# Patient Record
Sex: Male | Born: 1995 | Race: Black or African American | Hispanic: No | Marital: Single
Health system: Southern US, Community
[De-identification: ages and names within clinical notes are randomized; demographics above are authoritative.]

---
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
## 2017-12-05 ENCOUNTER — Emergency Department (HOSPITAL_COMMUNITY)
Admission: EM | Admit: 2017-12-05 | Discharge: 2017-12-06 | Disposition: A | Discharge status: Home / Self Care | Payer: No Typology Code available for payment source | Attending: Emergency Medicine | Admitting: Emergency Medicine

## 2017-12-05 ENCOUNTER — Other Ambulatory Visit: Payer: Self-pay

## 2017-12-05 ENCOUNTER — Encounter (HOSPITAL_COMMUNITY): Payer: Self-pay | Admitting: Emergency Medicine

## 2017-12-05 DIAGNOSIS — S39012A Strain of muscle, fascia and tendon of lower back, initial encounter: Secondary | ICD-10-CM | POA: Diagnosis not present

## 2017-12-05 DIAGNOSIS — Y998 Other external cause status: Secondary | ICD-10-CM | POA: Diagnosis not present

## 2017-12-05 DIAGNOSIS — Y92410 Unspecified street and highway as the place of occurrence of the external cause: Secondary | ICD-10-CM | POA: Diagnosis not present

## 2017-12-05 DIAGNOSIS — S3992XA Unspecified injury of lower back, initial encounter: Secondary | ICD-10-CM | POA: Diagnosis present

## 2017-12-05 DIAGNOSIS — Y939 Activity, unspecified: Secondary | ICD-10-CM | POA: Insufficient documentation
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
## 2017-12-06 ENCOUNTER — Emergency Department (HOSPITAL_COMMUNITY): Payer: No Typology Code available for payment source

## 2017-12-06 MED ORDER — NAPROXEN 250 MG PO TABS
500.0000 mg | ORAL_TABLET | Freq: Once | ORAL | Status: AC
  Administered 2017-12-06: 500 mg via ORAL
  Filled 2017-12-06: qty 2

## 2017-12-06 MED ORDER — CYCLOBENZAPRINE HCL 5 MG PO TABS
7.5000 mg | ORAL_TABLET | Freq: Once | ORAL | Status: AC
  Administered 2017-12-06: 7.5 mg via ORAL
  Filled 2017-12-06: qty 1.5

## 2017-12-06 MED ORDER — CYCLOBENZAPRINE HCL 10 MG PO TABS
10.0000 mg | ORAL_TABLET | Freq: Three times a day (TID) | ORAL | 0 refills | Status: AC | PRN
Start: 2017-12-06 — End: ?

## 2017-12-06 MED ORDER — NAPROXEN 375 MG PO TABS: 375.0000 mg | ORAL_TABLET | Freq: Two times a day (BID) | ORAL | 0 refills | Status: AC

## 2017-12-06 NOTE — Discharge Instructions (Addendum)
Workup has been normal. Please take medications as prescribed and instructed.  Please take the Naproxen as prescribed for pain. Do not take any additional NSAIDs including Motrin, Aleve, Ibuprofen, Advil. Have been given first dose in the ED.  Please the the flexeril for muscle relaxation. This medication will make you drowsy so avoid situation that could place you in danger.    SEEK IMMEDIATE MEDICAL ATTENTION IF: New numbness, tingling, weakness, or problem with the use of your arms or legs.  Severe back pain not relieved with medications.  Change in bowel or bladder control.  Urinary retention.  Numbness in your groin.  Increasing pain in any areas of the body (such as chest or abdominal pain).  Shortness of breath, dizziness or fainting.  Nausea (feeling sick to your stomach), vomiting, fever, or sweats.

## 2017-12-06 NOTE — ED Notes (Signed)
ED Provider at bedside. 

## 2017-12-06 NOTE — ED Notes (Signed)
Patient transported to X-ray 

## 2017-12-06 NOTE — ED Notes (Signed)
Pt verbalizes understanding of d/c instructions. Pt received prescriptions. Pt ambulatory at d/c with all belongings.  
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
## 2019-05-19 ENCOUNTER — Other Ambulatory Visit: Payer: Self-pay

## 2019-05-19 DIAGNOSIS — Z20822 Contact with and (suspected) exposure to covid-19: Secondary | ICD-10-CM

## 2019-05-20 LAB — NOVEL CORONAVIRUS, NAA: SARS-CoV-2, NAA: NOT DETECTED

## 2019-08-26 ENCOUNTER — Ambulatory Visit: Payer: Self-pay | Attending: Internal Medicine

## 2019-08-26 DIAGNOSIS — Z20822 Contact with and (suspected) exposure to covid-19: Secondary | ICD-10-CM

## 2019-08-27 LAB — NOVEL CORONAVIRUS, NAA: SARS-CoV-2, NAA: NOT DETECTED
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
## 2020-01-12 ENCOUNTER — Ambulatory Visit: Payer: No Typology Code available for payment source | Attending: Internal Medicine

## 2020-08-20 ENCOUNTER — Other Ambulatory Visit: Payer: Self-pay

## 2020-08-20 DIAGNOSIS — Z20822 Contact with and (suspected) exposure to covid-19: Secondary | ICD-10-CM

## 2020-08-21 LAB — NOVEL CORONAVIRUS, NAA: SARS-CoV-2, NAA: DETECTED — AB

## 2020-08-21 LAB — SARS-COV-2, NAA 2 DAY TAT

## 2020-08-25 ENCOUNTER — Other Ambulatory Visit: Payer: Self-pay

## 2020-08-25 DIAGNOSIS — Z20822 Contact with and (suspected) exposure to covid-19: Secondary | ICD-10-CM

## 2020-08-28 LAB — NOVEL CORONAVIRUS, NAA: SARS-CoV-2, NAA: DETECTED — AB

## 2023-07-10 ENCOUNTER — Emergency Department (HOSPITAL_COMMUNITY): Admission: EM | Admit: 2023-07-10 | Discharge: 2023-07-10 | Discharge status: Against Medical Advice | Payer: Self-pay

## 2023-07-10 NOTE — ED Notes (Signed)
Pt refusing to be seen and refusing to come to adult triage from peds side. Pt requesting to be taken off the list. Triage RN notified.

## 2023-07-13 ENCOUNTER — Other Ambulatory Visit: Payer: Self-pay

## 2023-07-13 ENCOUNTER — Emergency Department (HOSPITAL_COMMUNITY): Payer: No Typology Code available for payment source

## 2023-07-13 ENCOUNTER — Encounter (HOSPITAL_COMMUNITY): Payer: Self-pay

## 2023-07-13 ENCOUNTER — Emergency Department (HOSPITAL_COMMUNITY)
Admission: EM | Admit: 2023-07-13 | Discharge: 2023-07-13 | Disposition: A | Discharge status: Home / Self Care | Payer: No Typology Code available for payment source | Attending: Emergency Medicine | Admitting: Emergency Medicine

## 2023-07-13 DIAGNOSIS — S0501XA Injury of conjunctiva and corneal abrasion without foreign body, right eye, initial encounter: Secondary | ICD-10-CM | POA: Insufficient documentation

## 2023-07-13 DIAGNOSIS — Y92410 Unspecified street and highway as the place of occurrence of the external cause: Secondary | ICD-10-CM | POA: Insufficient documentation

## 2023-07-13 DIAGNOSIS — H5789 Other specified disorders of eye and adnexa: Secondary | ICD-10-CM | POA: Diagnosis present

## 2023-07-13 MED ORDER — FLUORESCEIN SODIUM 1 MG OP STRP
1.0000 | ORAL_STRIP | Freq: Once | OPHTHALMIC | Status: AC
  Administered 2023-07-13: 1 via OPHTHALMIC
  Filled 2023-07-13: qty 1

## 2023-07-13 MED ORDER — CIPROFLOXACIN HCL 0.3 % OP SOLN
2.0000 [drp] | Freq: Once | OPHTHALMIC | Status: AC
  Administered 2023-07-13: 2 [drp] via OPHTHALMIC
  Filled 2023-07-13: qty 2.5

## 2023-07-13 MED ORDER — TETRACAINE HCL 0.5 % OP SOLN
2.0000 [drp] | Freq: Once | OPHTHALMIC | Status: AC
  Administered 2023-07-13: 2 [drp] via OPHTHALMIC
  Filled 2023-07-13: qty 4

## 2023-07-13 MED ORDER — CIPROFLOXACIN HCL 0.3 % OP OINT: TOPICAL_OINTMENT | Freq: Two times a day (BID) | OPHTHALMIC | 0 refills | Status: AC

## 2023-07-13 NOTE — ED Notes (Signed)
This RN reviewed discharge instructions with patient. He verbalized understanding and denied any further questions. PT well appearing upon discharge and reports tolerable pain. Pt ambulated with stable gait to exit. Pt endorses ride home.

## 2023-07-13 NOTE — Discharge Instructions (Addendum)
As discussed, your imaging is reassuring. Your eye exam showed a small corneal abrasion at the upper outer right eye.  Because of this, apply 2 drops of ciprofloxacin eyedrops to your right eye 4 times a day for the next 5 days.  I have provided information for Dr. Graciela Husbands, and ophthalmologist.  Call his office tomorrow and schedule an appointment for reevaluation, to ensure your eye is getting better not worsening.  Otherwise, follow-up with your primary care in the next 5 to 7 days for reevaluation of your symptoms.  If you do not have one, there is a phone number in the discharge paperwork that you can call that we will be get established.  Get help right away if: You have severe eye pain that does not get better with medicine. You have severe vision loss.
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
